# Patient Record
Sex: Male | Born: 1985 | Race: Black or African American | Hispanic: No | Marital: Single | Smoking: Current every day smoker
Health system: Southern US, Community
[De-identification: ages and names within clinical notes are randomized; demographics above are authoritative.]

---

## 2013-05-19 ENCOUNTER — Emergency Department (HOSPITAL_COMMUNITY)
Admission: EM | Admit: 2013-05-19 | Discharge: 2013-05-19 | Disposition: A | Discharge status: Home / Self Care | Payer: Self-pay | Attending: Emergency Medicine | Admitting: Emergency Medicine

## 2013-05-19 ENCOUNTER — Emergency Department (HOSPITAL_COMMUNITY): Payer: Self-pay

## 2013-05-19 ENCOUNTER — Encounter (HOSPITAL_COMMUNITY): Payer: Self-pay | Admitting: *Deleted

## 2013-05-19 DIAGNOSIS — R0789 Other chest pain: Secondary | ICD-10-CM | POA: Insufficient documentation

## 2013-05-19 DIAGNOSIS — F17200 Nicotine dependence, unspecified, uncomplicated: Secondary | ICD-10-CM | POA: Insufficient documentation

## 2013-05-19 LAB — BASIC METABOLIC PANEL
BUN: 11 mg/dL (ref 6–23)
CO2: 23 mEq/L (ref 19–32)
Calcium: 9.2 mg/dL (ref 8.4–10.5)
Chloride: 99 mEq/L (ref 96–112)
Creatinine, Ser: 0.78 mg/dL (ref 0.50–1.35)
Glucose, Bld: 103 mg/dL — ABNORMAL HIGH (ref 70–99)
Potassium: 4.6 mEq/L (ref 3.5–5.1)

## 2013-05-19 LAB — POCT I-STAT TROPONIN I

## 2013-05-19 LAB — CBC
HCT: 40.5 % (ref 39.0–52.0)
Hemoglobin: 15.2 g/dL (ref 13.0–17.0)
MCV: 92.5 fL (ref 78.0–100.0)
RDW: 12.5 % (ref 11.5–15.5)
WBC: 6.1 10*3/uL (ref 4.0–10.5)

## 2013-05-19 MED ORDER — NAPROXEN 500 MG PO TABS
500.0000 mg | ORAL_TABLET | Freq: Once | ORAL | Status: AC
Start: 2013-05-19 — End: 2013-05-19
  Administered 2013-05-19: 500 mg via ORAL
  Filled 2013-05-19: qty 1

## 2013-05-19 MED ORDER — NAPROXEN 500 MG PO TABS: 500.0000 mg | ORAL_TABLET | Freq: Two times a day (BID) | ORAL | Status: DC

## 2013-05-19 NOTE — ED Provider Notes (Signed)
CSN: [id number]     Arrival date & time 05/19/13  0340 History   First MD Initiated Contact with Patient 05/19/13 0445     Chief Complaint  Patient presents with  . Chest Pain   (Consider location/radiation/quality/duration/timing/severity/associated sxs/prior Treatment) Patient is a 27 y.o. male presenting with chest pain. The history is provided by the patient. No language interpreter was used.  Chest Pain Pain location:  R chest Pain quality: pressure and throbbing   Pain radiates to:  Does not radiate Pain radiates to the back: no   Pain severity:  Mild Onset quality:  Gradual Duration:  2 days Timing:  Intermittent Progression:  Unchanged Chronicity:  New Relieved by:  Nothing Worsened by:  Deep breathing and movement Ineffective treatments: BC powder. Associated symptoms: no cough, no fever, no nausea, no near-syncope, no numbness, no palpitations, no shortness of breath, not vomiting and no weakness   Risk factors comment:  None; patient otherwise healthy   History reviewed. No pertinent past medical history. History reviewed. No pertinent past surgical history. Family History  Problem Relation Age of Onset  . Family history unknown: Yes   History  Substance Use Topics  . Smoking status: Current Every Day Smoker -- 1.00 packs/day for 10 years    Types: Cigarettes  . Smokeless tobacco: Not on file  . Alcohol Use: Yes     Comment: occassionally    Review of Systems  Constitutional: Negative for fever.  Respiratory: Negative for cough and shortness of breath.   Cardiovascular: Positive for chest pain. Negative for palpitations and near-syncope.  Gastrointestinal: Negative for nausea and vomiting.  Neurological: Negative for weakness and numbness.  All other systems reviewed and are negative.    Allergies  Review of patient's allergies indicates no known allergies.  Home Medications   Current Outpatient Rx  Name  Route  Sig  Dispense  Refill  . naproxen  (NAPROSYN) 500 MG tablet   Oral   Take 1 tablet (500 mg total) by mouth 2 (two) times daily.   30 tablet   0    BP 142/82  Pulse 68  Temp(Src) 98 F (36.7 C) (Oral)  Resp 16  SpO2 98%  Physical Exam  Nursing note and vitals reviewed. Constitutional: He is oriented to person, place, and time. He appears well-developed and well-nourished. No distress.  HENT:  Head: Normocephalic and atraumatic.  Mouth/Throat: Oropharynx is clear and moist. No oropharyngeal exudate.  Eyes: Conjunctivae and EOM are normal. Pupils are equal, round, and reactive to light. No scleral icterus.  Neck: Normal range of motion.  Cardiovascular: Normal rate, regular rhythm, normal heart sounds and intact distal pulses.   Pulses:      Radial pulses are 2+ on the right side, and 2+ on the left side.  Pulmonary/Chest: Effort normal and breath sounds normal. No respiratory distress. He has no wheezes. He has no rales. He exhibits tenderness (R mid chest with palpation).  Abdominal: Soft. He exhibits no distension. There is no tenderness.  Musculoskeletal: Normal range of motion.  Neurological: He is alert and oriented to person, place, and time.  Skin: Skin is warm and dry. No rash noted. He is not diaphoretic. No erythema. No pallor.  Psychiatric: He has a normal mood and affect. His behavior is normal.    ED Course  Procedures (including critical care time) Labs Review Labs Reviewed  CBC - Abnormal; Notable for the following:    MCH 34.7 (*)    MCHC 37.5 (*)  All other components within normal limits  BASIC METABOLIC PANEL - Abnormal; Notable for the following:    Sodium 132 (*)    Glucose, Bld 103 (*)    All other components within normal limits  POCT I-STAT TROPONIN I   Imaging Review Dg Chest Port 1 View  05/19/2013   CLINICAL DATA:  Chest pain.  EXAM: PORTABLE CHEST - 1 VIEW  COMPARISON:  None.  FINDINGS: The heart size and mediastinal contours are within normal limits. Both lungs are clear.  The visualized skeletal structures are unremarkable.  IMPRESSION: No active disease.   Electronically Signed   By: Charlett Nose M.D.   On: 05/19/2013 04:12    Date: 05/19/2013  Rate: 61  Rhythm: normal sinus rhythm  QRS Axis: normal  Intervals: normal  ST/T Wave abnormalities: nonspecific ST changes; findings c/w early repolarization  Conduction Disutrbances:none  Narrative Interpretation: NSR; no STEMI  Old EKG Reviewed: none available I have personally reviewed and interpreted this EKG  MDM   1. Atypical chest pain    27 year old otherwise healthy male presents for right-sided chest pain. Patient well and nontoxic appearing, hemodynamically stable, and afebrile. Doubt ACS as cause of symptoms given patient's age, lack of risk factors, atypical nature of symptoms, reassuring EKG, and negative troponin. Doubt pulmonary embolism as patient is without tachycardia, tachypnea, dyspnea, or hypoxia. Chest x-ray without evidence of pneumonia, pneumothorax, or pleural effusion by my interpretation. Symptoms likely musculoskeletal in origin. Patient treated in ED with naproxen with improvement in symptoms. Patient stable and appropriate for discharge with primary care followup for further evaluation of symptoms. Approximately prescribed as needed for pain control and ice to the affected area advised. Return precautions discussed and patient agreeable to plan with no unaddressed concerns.    Antony Madura, PA-C 05/20/13 [phone]

## 2013-05-19 NOTE — ED Notes (Signed)
Two days ago chest began to hurt. Throbbing/pressure in the center of chest. Gradual onset, pt took [facility] powder to attempt to eliminate the pain with no relief.

## 2013-05-20 NOTE — ED Provider Notes (Signed)
Medical screening examination/treatment/procedure(s) were performed by non-physician practitioner and as supervising physician I was immediately available for consultation/collaboration.  Sunnie Nielsen, MD 05/20/13 2251

## 2013-07-19 ENCOUNTER — Emergency Department (HOSPITAL_COMMUNITY)
Admission: EM | Admit: 2013-07-19 | Discharge: 2013-07-19 | Disposition: A | Discharge status: Home / Self Care | Payer: Self-pay | Attending: Emergency Medicine | Admitting: Emergency Medicine

## 2013-07-19 DIAGNOSIS — A6000 Herpesviral infection of urogenital system, unspecified: Secondary | ICD-10-CM | POA: Insufficient documentation

## 2013-07-19 DIAGNOSIS — F17200 Nicotine dependence, unspecified, uncomplicated: Secondary | ICD-10-CM | POA: Insufficient documentation

## 2013-07-19 MED ORDER — ACYCLOVIR 400 MG PO TABS: 400.0000 mg | ORAL_TABLET | Freq: Three times a day (TID) | ORAL | Status: DC

## 2013-07-19 MED ORDER — HYDROCODONE-ACETAMINOPHEN 5-325 MG PO TABS: 2.0000 | ORAL_TABLET | ORAL | Status: DC | PRN

## 2013-07-19 NOTE — ED Provider Notes (Signed)
CSN: [id number]     Arrival date & time 07/19/13  2037 History   First MD Initiated Contact with Patient 07/19/13 2053     Chief Complaint  Patient presents with  . STD check    (Consider location/radiation/quality/duration/timing/severity/associated sxs/prior Treatment) HPI Comments: Patient presents to the ER for evaluation of painful lesions on his penis. Patient reports that symptoms began yesterday. He reports continuous, moderate to severe pain in the site of the lesions. He has not had any significant discharge from the penis. Patient never had similar lesions before. He denies any direct trauma.   No past medical history on file. No past surgical history on file. Family History  Problem Relation Age of Onset  . Family history unknown: Yes   History  Substance Use Topics  . Smoking status: Current Every Day Smoker -- 1.00 packs/day for 10 years    Types: Cigarettes  . Smokeless tobacco: Not on file  . Alcohol Use: Yes     Comment: occassionally    Review of Systems  Genitourinary: Positive for penile pain.  Skin: Positive for wound.    Allergies  Review of patient's allergies indicates no known allergies.  Home Medications  No current outpatient prescriptions on file. BP 140/93  Pulse 75  Temp(Src) 97.8 F (36.6 C) (Oral)  Resp 20  Ht 5\' 9"  (1.753 m)  Wt 162 lb (73.483 kg)  BMI 23.91 kg/m2  SpO2 98% Physical Exam  Genitourinary: Testes normal.    Circumcised.  Skin: Lesion (multiple ulcerated areas on penis) noted.    ED Course  Procedures (including critical care time) Labs Review Labs Reviewed  GC/CHLAMYDIA PROBE AMP  HERPES SIMPLEX VIRUS CULTURE  RPR  HIV ANTIBODY (ROUTINE TESTING)   Imaging Review No results found.  EKG Interpretation   None       MDM  Diagnosis: Genital herpes  Patient presents to the ER with what appears to be a primary herpes outbreak on his penis. Patient has multiple ulcerated areas it started as blisters.  Vital culture obtained for herpes. Additionally, GC Chlamydia, RPR and HIV ordered. Patient will be started empirically on acyclovir, Vicodin for pain.    Gilda Crease, MD 07/19/13 2106

## 2013-07-19 NOTE — ED Notes (Signed)
Pt states he has some blisters to his "private area". Pt also states he has some discharge from his penis. Symptoms x 2 days.

## 2013-07-20 LAB — RPR TITER: RPR Titer: 1:32 {titer} — AB

## 2013-07-20 LAB — T.PALLIDUM AB, IGG: T pallidum Antibodies (TP-PA): >8 S/CO — ABNORMAL HIGH (ref <0.90)

## 2013-07-20 LAB — GC/CHLAMYDIA PROBE AMP
CT Probe RNA: NEGATIVE
GC Probe RNA: NEGATIVE

## 2013-07-20 LAB — HIV ANTIBODY (ROUTINE TESTING W REFLEX): HIV: NONREACTIVE

## 2013-07-20 LAB — RPR: RPR Ser Ql: REACTIVE — AB

## 2013-07-21 LAB — HERPES SIMPLEX VIRUS CULTURE: Culture: DETECTED

## 2013-07-21 NOTE — ED Notes (Signed)
Positive report called to  [facility]  @ [facility] .They will up with patient.

## 2013-07-22 ENCOUNTER — Telehealth (HOSPITAL_COMMUNITY): Payer: Self-pay

## 2013-07-22 NOTE — ED Notes (Signed)
Call from [facility] for addl Demographics for pt w/recent (+) RPR.  Information provided.

## 2013-12-05 ENCOUNTER — Encounter (HOSPITAL_COMMUNITY): Payer: Self-pay | Admitting: Emergency Medicine

## 2013-12-05 ENCOUNTER — Emergency Department (HOSPITAL_COMMUNITY)
Admission: EM | Admit: 2013-12-05 | Discharge: 2013-12-05 | Discharge status: Against Medical Advice | Payer: Self-pay | Attending: Emergency Medicine | Admitting: Emergency Medicine

## 2013-12-05 DIAGNOSIS — R059 Cough, unspecified: Secondary | ICD-10-CM | POA: Insufficient documentation

## 2013-12-05 DIAGNOSIS — R6889 Other general symptoms and signs: Secondary | ICD-10-CM | POA: Insufficient documentation

## 2013-12-05 DIAGNOSIS — R079 Chest pain, unspecified: Secondary | ICD-10-CM | POA: Insufficient documentation

## 2013-12-05 DIAGNOSIS — F17200 Nicotine dependence, unspecified, uncomplicated: Secondary | ICD-10-CM | POA: Insufficient documentation

## 2013-12-05 DIAGNOSIS — R5383 Other fatigue: Secondary | ICD-10-CM

## 2013-12-05 DIAGNOSIS — R42 Dizziness and giddiness: Secondary | ICD-10-CM | POA: Insufficient documentation

## 2013-12-05 DIAGNOSIS — R05 Cough: Secondary | ICD-10-CM | POA: Insufficient documentation

## 2013-12-05 DIAGNOSIS — R5381 Other malaise: Secondary | ICD-10-CM | POA: Insufficient documentation

## 2013-12-05 NOTE — ED Notes (Signed)
Pt states he has chest pain, weakness, hot, dizziness  Pt states he was outside Saturday night for a couple hours without a shirt on  Pt states states he has cough, sneezing, runny nose

## 2013-12-05 NOTE — ED Notes (Signed)
Pt did not answer when name called in waiting room or immediate area outside

## 2013-12-05 NOTE — ED Notes (Signed)
Pt did not answer when name called; pt not in lobby or immediate area outside

## 2013-12-05 NOTE — ED Notes (Signed)
PT walked out of waiting room after asking how much longer he would have to wait; pt did not return to lobby to await evaluation.

## 2013-12-09 ENCOUNTER — Emergency Department (HOSPITAL_COMMUNITY)
Admission: EM | Admit: 2013-12-09 | Discharge: 2013-12-09 | Disposition: A | Discharge status: Home / Self Care | Payer: Self-pay | Attending: Emergency Medicine | Admitting: Emergency Medicine

## 2013-12-09 ENCOUNTER — Encounter (HOSPITAL_COMMUNITY): Payer: Self-pay | Admitting: Emergency Medicine

## 2013-12-09 DIAGNOSIS — F17200 Nicotine dependence, unspecified, uncomplicated: Secondary | ICD-10-CM | POA: Insufficient documentation

## 2013-12-09 DIAGNOSIS — J3489 Other specified disorders of nose and nasal sinuses: Secondary | ICD-10-CM | POA: Insufficient documentation

## 2013-12-09 DIAGNOSIS — R059 Cough, unspecified: Secondary | ICD-10-CM | POA: Insufficient documentation

## 2013-12-09 DIAGNOSIS — Z791 Long term (current) use of non-steroidal anti-inflammatories (NSAID): Secondary | ICD-10-CM | POA: Insufficient documentation

## 2013-12-09 DIAGNOSIS — H6590 Unspecified nonsuppurative otitis media, unspecified ear: Secondary | ICD-10-CM | POA: Insufficient documentation

## 2013-12-09 DIAGNOSIS — R05 Cough: Secondary | ICD-10-CM | POA: Insufficient documentation

## 2013-12-09 DIAGNOSIS — H6690 Otitis media, unspecified, unspecified ear: Secondary | ICD-10-CM

## 2013-12-09 MED ORDER — OXYMETAZOLINE HCL 0.05 % NA SOLN
1.0000 | Freq: Two times a day (BID) | NASAL | Status: DC
  Administered 2013-12-09: 1 via NASAL
  Filled 2013-12-09: qty 15

## 2013-12-09 MED ORDER — AMOXICILLIN 500 MG PO CAPS
500.0000 mg | ORAL_CAPSULE | Freq: Once | ORAL | Status: AC
  Administered 2013-12-09: 500 mg via ORAL
  Filled 2013-12-09: qty 1

## 2013-12-09 MED ORDER — AMOXICILLIN 500 MG PO CAPS: 500.0000 mg | ORAL_CAPSULE | Freq: Three times a day (TID) | ORAL | Status: AC

## 2013-12-09 NOTE — ED Notes (Signed)
Pt was blowing his nose this am and felt his right ear pop, he now complains of severe pain in that ear

## 2013-12-09 NOTE — ED Provider Notes (Addendum)
CSN: [id number]     Arrival date & time 12/09/13  0630 History   First MD Initiated Contact with Patient 12/09/13 [phone]     Chief Complaint  Patient presents with  . Otalgia     (Consider location/radiation/quality/duration/timing/severity/associated sxs/prior Treatment) Patient is a 28 y.o. male presenting with ear pain. The history is provided by the patient.  Otalgia Location:  Right Behind ear:  No abnormality Quality:  Aching, sharp and shooting Severity:  Severe Onset quality:  Gradual Duration:  3 hours Timing:  Constant Progression:  Worsening Chronicity:  New Context comment:  Has had congestion and blowing his nose and right ear started to hurt really bad Relieved by:  None tried Worsened by:  Cold air Ineffective treatments:  None tried Associated symptoms: congestion, cough and rhinorrhea   Associated symptoms: no ear discharge, no fever, no hearing loss and no sore throat   Risk factors: no chronic ear infection     History reviewed. No pertinent past medical history. History reviewed. No pertinent past surgical history. History reviewed. No pertinent family history. History  Substance Use Topics  . Smoking status: Current Every Day Smoker -- 1.00 packs/day for 10 years    Types: Cigarettes  . Smokeless tobacco: Not on file  . Alcohol Use: Yes     Comment: occassionally    Review of Systems  Constitutional: Negative for fever.  HENT: Positive for congestion, ear pain and rhinorrhea. Negative for ear discharge, hearing loss and sore throat.   Respiratory: Positive for cough.   All other systems reviewed and are negative.     Allergies  Review of patient's allergies indicates no known allergies.  Home Medications   Prior to Admission medications   Medication Sig Start Date End Date Taking? Authorizing Provider  acetaminophen (TYLENOL) 500 MG tablet Take 1,000 mg by mouth every 6 (six) hours as needed for headache.   Yes Historical Provider, MD   ibuprofen (ADVIL,MOTRIN) 200 MG tablet Take 400 mg by mouth every 6 (six) hours as needed for moderate pain.   Yes Historical Provider, MD   BP 141/94  Pulse 73  Temp(Src) 98 F (36.7 C) (Oral)  Resp 18  Ht 6' (1.829 m)  Wt 170 lb (77.111 kg)  BMI 23.05 kg/m2  SpO2 98% Physical Exam  Nursing note and vitals reviewed. Constitutional: He is oriented to person, place, and time. He appears well-developed and well-nourished. No distress.  HENT:  Head: Normocephalic and atraumatic.  Right Ear: Ear canal normal. No drainage or swelling. Tympanic membrane is injected, erythematous and bulging. Tympanic membrane is not perforated. A middle ear effusion is present.  Left Ear: Ear canal normal. No drainage or swelling. Tympanic membrane is not perforated, not erythematous and not bulging. A middle ear effusion is present.  Eyes: EOM are normal. Pupils are equal, round, and reactive to light.  Cardiovascular: Normal rate.   Pulmonary/Chest: Effort normal.  Lymphadenopathy:    He has no cervical adenopathy.  Neurological: He is alert and oriented to person, place, and time.  Skin: Skin is warm and dry.  Psychiatric: He has a normal mood and affect. His behavior is normal.    ED Course  Procedures (including critical care time) Labs Review Labs Reviewed - No data to display  Imaging Review No results found.   EKG Interpretation None      MDM   Final diagnoses:  Otitis media    Pt with URI sx who has now developed OM of the right  side.  Denies fever but persistent congestion for the last 2 days.  Pt o/w well appearing.  Will treat with amoxicillin and afrin/sudafed.    Gwyneth SproutWhitney Natahsa Marian, MD 12/09/13 [phone]  Gwyneth SproutWhitney Bricen Victory, MD 12/09/13 [phone]

## 2014-09-07 IMAGING — CR DG CHEST 1V PORT
1 series · 1 of 1 positions shown · non-contrast
Comparison: None.

CLINICAL DATA: Chest pain.

EXAM:
PORTABLE CHEST - 1 VIEW

[AP]
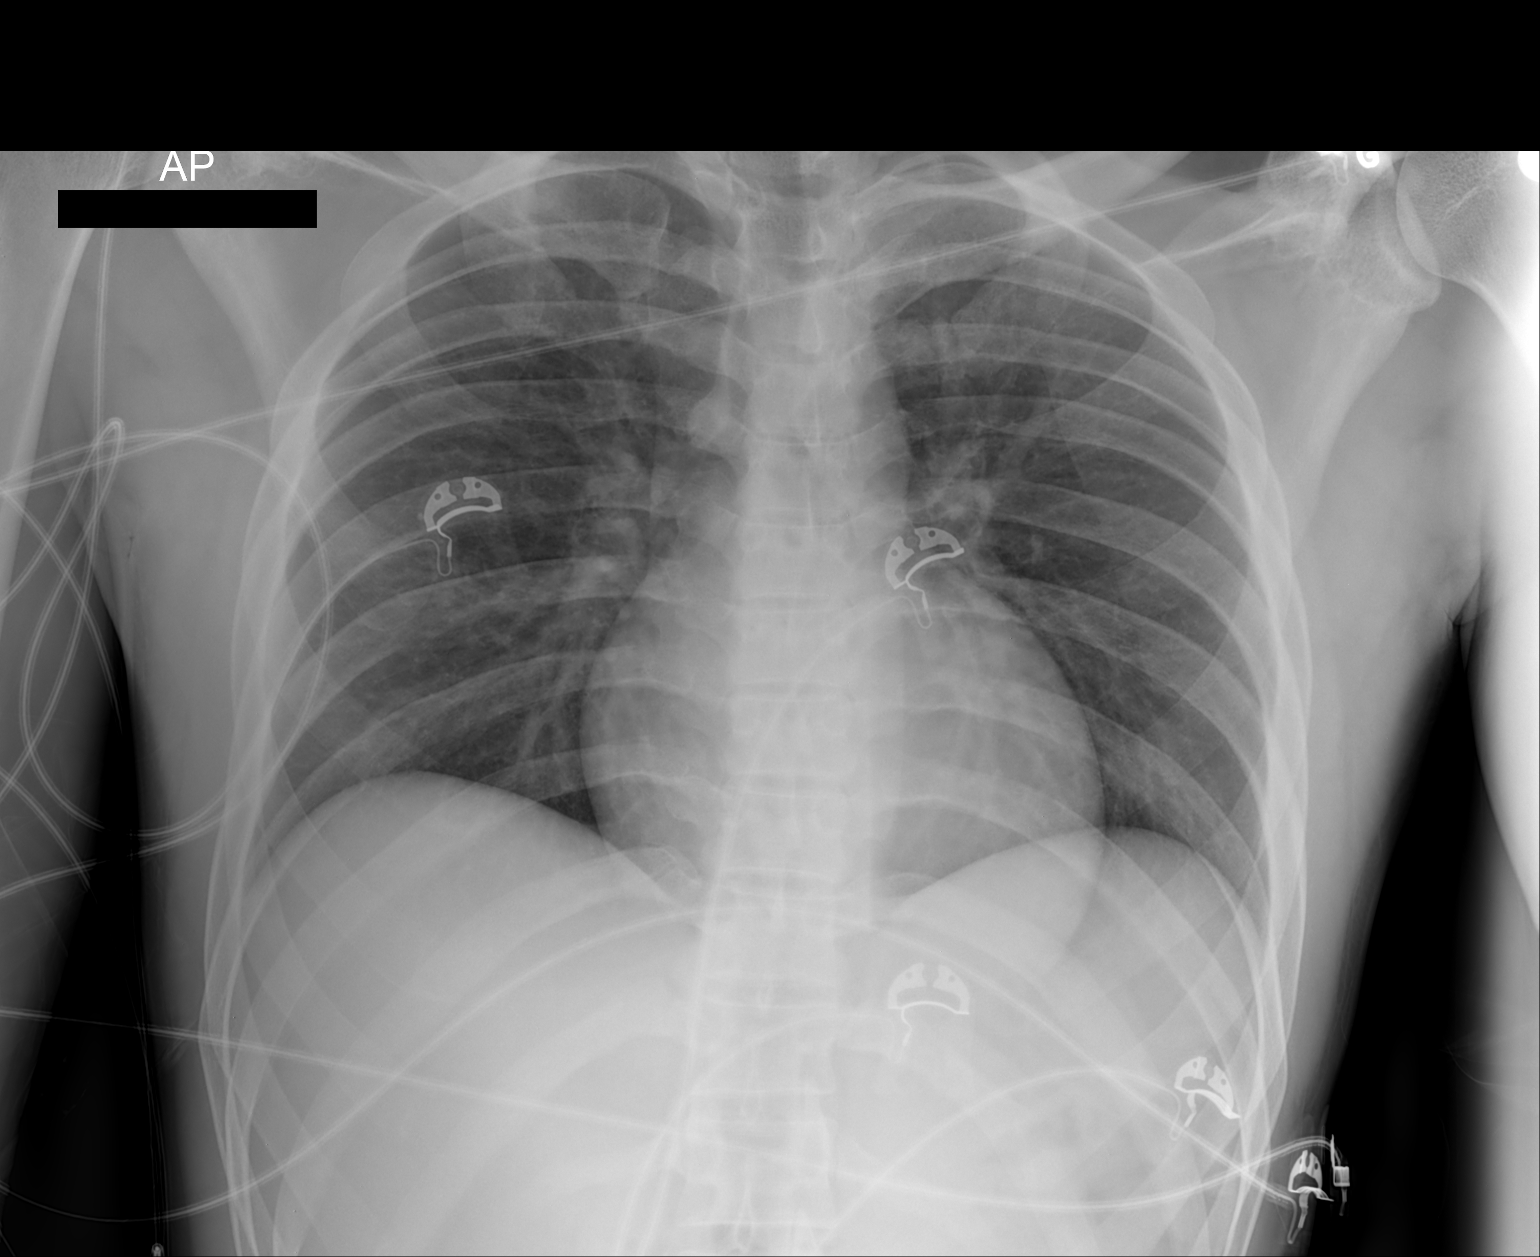

[1 of 1 positions shown; findings below may reference images not displayed]

FINDINGS: The heart size and mediastinal contours are within normal limits.
Both lungs are clear. The visualized skeletal structures are
unremarkable.
IMPRESSION: No active disease.

## 2016-11-16 DEATH — deceased
